# Patient Record
Sex: Male | Born: 1980 | Race: White | Marital: Single | Smoking: Former smoker
Health system: Southern US, Community
[De-identification: ages and names within clinical notes are randomized; demographics above are authoritative.]

## PROBLEM LIST (undated history)

## (undated) ENCOUNTER — Ambulatory Visit: Admission: EM

## (undated) HISTORY — PX: FRACTURE SURGERY: SHX138

## (undated) HISTORY — PX: EYE SURGERY: SHX253

---

## 2014-12-14 ENCOUNTER — Emergency Department: Payer: Self-pay

## 2014-12-14 ENCOUNTER — Encounter: Payer: Self-pay | Admitting: Emergency Medicine

## 2014-12-14 ENCOUNTER — Emergency Department
Admission: EM | Admit: 2014-12-14 | Discharge: 2014-12-14 | Disposition: A | Discharge status: Home / Self Care | Payer: Self-pay | Attending: Emergency Medicine | Admitting: Emergency Medicine

## 2014-12-14 DIAGNOSIS — N23 Unspecified renal colic: Secondary | ICD-10-CM

## 2014-12-14 DIAGNOSIS — R109 Unspecified abdominal pain: Secondary | ICD-10-CM

## 2014-12-14 LAB — CBC WITH DIFFERENTIAL/PLATELET
BASOS PCT: 1 %
Basophils Absolute: 0.1 10*3/uL (ref 0–0.1)
EOS PCT: 5 %
Eosinophils Absolute: 0.4 10*3/uL (ref 0–0.7)
HEMATOCRIT: 43.3 % (ref 40.0–52.0)
HEMOGLOBIN: 14.5 g/dL (ref 13.0–18.0)
Lymphocytes Relative: 22 %
Lymphs Abs: 2 10*3/uL (ref 1.0–3.6)
MCH: 30.6 pg (ref 26.0–34.0)
MCHC: 33.6 g/dL (ref 32.0–36.0)
MCV: 91.2 fL (ref 80.0–100.0)
MONO ABS: 0.4 10*3/uL (ref 0.2–1.0)
Monocytes Relative: 4 %
Neutro Abs: 6 10*3/uL (ref 1.4–6.5)
Neutrophils Relative %: 68 %
Platelets: 278 10*3/uL (ref 150–440)
RBC: 4.75 MIL/uL (ref 4.40–5.90)
RDW: 12.8 % (ref 11.5–14.5)
WBC: 8.9 10*3/uL (ref 3.8–10.6)

## 2014-12-14 LAB — COMPREHENSIVE METABOLIC PANEL
ALBUMIN: 4.2 g/dL (ref 3.5–5.0)
ALT: 12 U/L — ABNORMAL LOW (ref 17–63)
ANION GAP: 8 (ref 5–15)
AST: 17 U/L (ref 15–41)
Alkaline Phosphatase: 41 U/L (ref 38–126)
BILIRUBIN TOTAL: 0.5 mg/dL (ref 0.3–1.2)
BUN: 12 mg/dL (ref 6–20)
CALCIUM: 8.8 mg/dL — AB (ref 8.9–10.3)
CO2: 28 mmol/L (ref 22–32)
CREATININE: 1.2 mg/dL (ref 0.61–1.24)
Chloride: 103 mmol/L (ref 101–111)
GFR calc Af Amer: >60 mL/min (ref >60)
GFR calc non Af Amer: >60 mL/min (ref >60)
Glucose, Bld: 137 mg/dL — ABNORMAL HIGH (ref 65–99)
POTASSIUM: 3.8 mmol/L (ref 3.5–5.1)
SODIUM: 139 mmol/L (ref 135–145)
TOTAL PROTEIN: 6.9 g/dL (ref 6.5–8.1)

## 2014-12-14 LAB — URINALYSIS COMPLETE WITH MICROSCOPIC (ARMC ONLY)
Bacteria, UA: NONE SEEN
Bilirubin Urine: NEGATIVE
GLUCOSE, UA: NEGATIVE mg/dL
HGB URINE DIPSTICK: NEGATIVE
Ketones, ur: NEGATIVE mg/dL
LEUKOCYTES UA: NEGATIVE
NITRITE: NEGATIVE
PROTEIN: NEGATIVE mg/dL
Specific Gravity, Urine: 1.02 (ref 1.005–1.030)
Squamous Epithelial / LPF: NONE SEEN
pH: 6 (ref 5.0–8.0)

## 2014-12-14 LAB — LIPASE, BLOOD: Lipase: 33 U/L (ref 22–51)

## 2014-12-14 MED ORDER — OXYCODONE-ACETAMINOPHEN 5-325 MG PO TABS: 1.0000 | ORAL_TABLET | Freq: Four times a day (QID) | ORAL | Status: DC | PRN

## 2014-12-14 MED ORDER — HYDROMORPHONE HCL 1 MG/ML IJ SOLN
INTRAMUSCULAR | Status: AC
  Filled 2014-12-14: qty 1

## 2014-12-14 MED ORDER — KETOROLAC TROMETHAMINE 30 MG/ML IJ SOLN
30.0000 mg | Freq: Once | INTRAMUSCULAR | Status: AC
  Administered 2014-12-14: 30 mg via INTRAVENOUS

## 2014-12-14 MED ORDER — ONDANSETRON HCL 4 MG/2ML IJ SOLN
4.0000 mg | Freq: Once | INTRAMUSCULAR | Status: AC
  Administered 2014-12-14: 4 mg via INTRAVENOUS

## 2014-12-14 MED ORDER — SODIUM CHLORIDE 0.9 % IV SOLN
Freq: Once | INTRAVENOUS | Status: AC
  Administered 2014-12-14: 12:00:00 via INTRAVENOUS

## 2014-12-14 MED ORDER — TAMSULOSIN HCL 0.4 MG PO CAPS: 0.4000 mg | ORAL_CAPSULE | ORAL | Status: DC

## 2014-12-14 MED ORDER — FENTANYL CITRATE (PF) 100 MCG/2ML IJ SOLN
INTRAMUSCULAR | Status: AC
  Administered 2014-12-14: 50 ug via INTRAVENOUS
  Filled 2014-12-14: qty 2

## 2014-12-14 MED ORDER — HYDROMORPHONE HCL 1 MG/ML IJ SOLN
0.5000 mg | Freq: Once | INTRAMUSCULAR | Status: AC
  Administered 2014-12-14: 0.5 mg via INTRAVENOUS

## 2014-12-14 MED ORDER — FENTANYL CITRATE (PF) 100 MCG/2ML IJ SOLN
50.0000 ug | Freq: Once | INTRAMUSCULAR | Status: AC
  Administered 2014-12-14: 50 ug via INTRAVENOUS

## 2014-12-14 MED ORDER — ONDANSETRON HCL 4 MG/2ML IJ SOLN
INTRAMUSCULAR | Status: AC
  Administered 2014-12-14: 4 mg via INTRAVENOUS
  Filled 2014-12-14: qty 2

## 2014-12-14 MED ORDER — ONDANSETRON HCL 4 MG/2ML IJ SOLN
INTRAMUSCULAR | Status: AC
  Filled 2014-12-14: qty 2

## 2014-12-14 MED ORDER — TAMSULOSIN HCL 0.4 MG PO CAPS: 0.4000 mg | ORAL_CAPSULE | Freq: Once | ORAL | Status: DC

## 2014-12-14 MED ORDER — KETOROLAC TROMETHAMINE 30 MG/ML IJ SOLN
INTRAMUSCULAR | Status: AC
  Filled 2014-12-14: qty 1

## 2014-12-14 MED ORDER — ONDANSETRON HCL 4 MG PO TABS: 4.0000 mg | ORAL_TABLET | Freq: Every day | ORAL | Status: DC | PRN

## 2014-12-14 MED ORDER — HYDROMORPHONE HCL 1 MG/ML IJ SOLN
1.0000 mg | Freq: Once | INTRAMUSCULAR | Status: AC
Start: 2014-12-14 — End: 2014-12-14
  Administered 2014-12-14: 1 mg via INTRAVENOUS

## 2014-12-14 NOTE — ED Notes (Signed)
Patient transported to CT 

## 2014-12-14 NOTE — ED Notes (Signed)
Pt c/o left side flank pain acute onset. Some nausea.

## 2014-12-14 NOTE — ED Provider Notes (Signed)
[facility] Emergency Department Provider Note     Time seen: ----------------------------------------- 11:34 AM on 12/14/2014 -----------------------------------------    I have reviewed the triage vital signs and the nursing notes.   HISTORY  Chief Complaint Abdominal Pain    HPI Mathew Olsen is a 34 y.o. male since ER for sudden onset left-sided abdominal pain and flank pain. Patient does report history of nausea.Patient discussed pain is sharp and stabbing left-sided nothing makes it better or worse. Associated nausea and vomiting. Does not have history of same but there is a family history for kidney stones. Currently severe     History reviewed. No pertinent past medical history.  There are no active problems to display for this patient.   History reviewed. No pertinent past surgical history.  No current outpatient prescriptions on file.  Allergies Review of patient's allergies indicates no known allergies.  No family history on file.  Social History History  Substance Use Topics  . Smoking status: Never Smoker   . Smokeless tobacco: Not on file  . Alcohol Use: No    Review of Systems Constitutional: Negative for fever. Eyes: Negative for visual changes. ENT: Negative for sore throat. Cardiovascular: Negative for chest pain. Respiratory: Negative for shortness of breath. Gastrointestinal: Abdominal pain and nausea vomiting Genitourinary: Negative for dysuria. Musculoskeletal: Negative for back pain. Skin: Negative for rash. Neurological: Negative for headaches, focal weakness or numbness.  10-point ROS otherwise negative.  ____________________________________________   PHYSICAL EXAM:  VITAL SIGNS: ED Triage Vitals  Enc Vitals Group     BP 12/14/14 1035 130/89 mmHg     Pulse Rate 12/14/14 1035 56     Resp 12/14/14 1035 18     Temp 12/14/14 1035 98 F (36.7 C)     Temp Source 12/14/14 1035 Oral     SpO2 12/14/14  1035 98 %     Weight 12/14/14 1035 155 lb (70.308 kg)     Height 12/14/14 1035 5\' 6"  (1.676 m)     Head Cir --      Peak Flow --      Pain Score 12/14/14 1036 8     Pain Loc --      Pain Edu? --      Excl. in GC? --     Constitutional: Alert and oriented. Mild to moderate distress. Pale Eyes: Conjunctivae are normal. PERRL. Normal extraocular movements. ENT   Head: Normocephalic and atraumatic.   Nose: No congestion/rhinnorhea.   Mouth/Throat: Mucous membranes are moist.   Neck: No stridor. Hematological/Lymphatic/Immunilogical: No cervical lymphadenopathy. Cardiovascular: Normal rate, regular rhythm. Normal and symmetric distal pulses are present in all extremities. No murmurs, rubs, or gallops. Respiratory: Normal respiratory effort without tachypnea nor retractions. Breath sounds are clear and equal bilaterally. No wheezes/rales/rhonchi. Gastrointestinal: Left flank and CVA tenderness is noted. Normal bowel sounds. Musculoskeletal: Nontender with normal range of motion in all extremities. No joint effusions.  No lower extremity tenderness nor edema. Neurologic:  Normal speech and language. No gross focal neurologic deficits are appreciated. Speech is normal. No gait instability. Skin:  Skin is warm, dry and intact. No rash noted. Pallor is noted Psychiatric: Mood and affect are normal. Speech and behavior are normal. Patient exhibits appropriate insight and judgment.  ____________________________________________    LABS (pertinent positives/negatives)  Labs Reviewed  COMPREHENSIVE METABOLIC PANEL - Abnormal; Notable for the following:    Glucose, Bld 137 (*)    Calcium 8.8 (*)    ALT 12 (*)  All other components within normal limits  URINALYSIS COMPLETEWITH MICROSCOPIC ([facility] ONLY) - Abnormal; Notable for the following:    Color, Urine AMBER (*)    APPearance HAZY (*)    All other components within normal limits  CBC WITH DIFFERENTIAL/PLATELET  LIPASE,  BLOOD   ____________________________________________  ED COURSE:  Pertinent labs & imaging results that were available during my care of the patient were reviewed by me and considered in my medical decision making (see chart for details).  Patient received IV fluid as well as IV Toradol and Zofran. Likely renal colic ____________________________________________   RADIOLOGY  CT of the pelvis without contrast IMPRESSION: 2 mm calculus left ureterovesical junction causing mild hydronephrosis on the left. Multiple small intrarenal calculi bilaterally.  No bowel obstruction. No abscess. The periappendiceal region shows no inflammation.  There is a probable tiny calculus in the gallbladder. Gallbladder wall does not appear thickened by CT. ____________________________________________    FINAL ASSESSMENT AND PLAN  Renal colic Plan: Patient with 2 mm distal ureteral stone, will be discharged with Flomax, pain medicine and urology follow-up. Hopefully he will be able to pass the stone in the next few days.    Emily Filbert, MD   Emily Filbert, MD 12/14/14 [phone]

## 2014-12-14 NOTE — ED Notes (Signed)
Family at bedside. 

## 2014-12-14 NOTE — ED Notes (Signed)
Vital signs stable. 

## 2014-12-14 NOTE — ED Notes (Signed)
Pt noted to have heart rate in the 40's. edp made aware. Pt asymptomatic at this time. Vss. Cardiac monitor applied.  Cont to monitor.

## 2014-12-14 NOTE — Discharge Instructions (Signed)

## 2014-12-14 NOTE — ED Notes (Signed)
Sudden onset of left side abd and flank pain .The patient complains of pos nausea

## 2015-12-15 IMAGING — CT CT RENAL STONE PROTOCOL
1 of 2 series · 5 of 32 positions shown, 10 images · non-contrast
Comparison: None.

CLINICAL DATA: Abdominal pain for 2 weeks. One day history of more
focal left flank pain

EXAM:
CT ABDOMEN AND PELVIS WITHOUT CONTRAST
TECHNIQUE: Multidetector CT imaging of the abdomen and pelvis was performed
following the standard protocol without oral or intravenous contrast
material administration.

[Series 4: lung windows · axial · 0.66mm/px · z∈[-167,-72]mm · 5 of 29 slices shown, 10 images]
[im 5/29  soft-tissue]
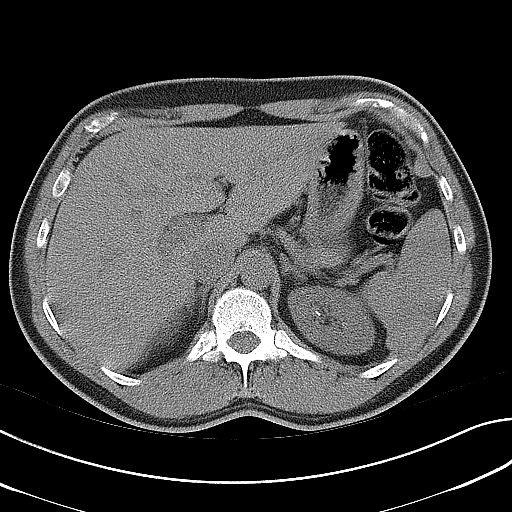
[im 5/29  bone]
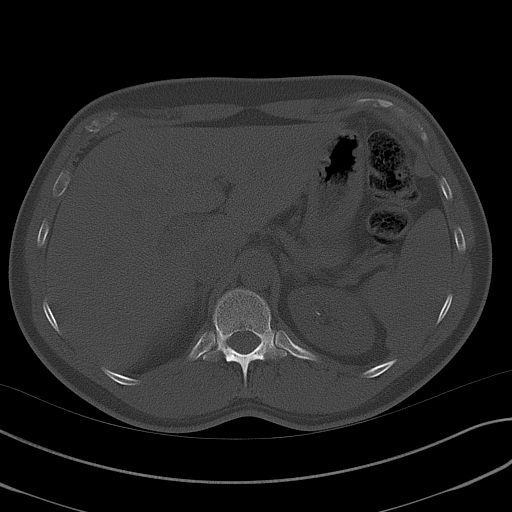
[im 10/29  soft-tissue]
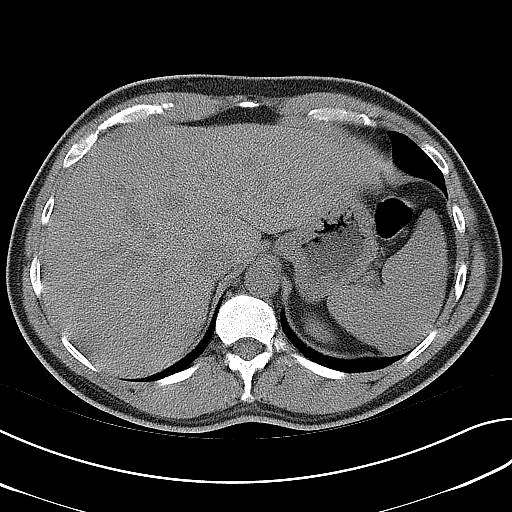
[im 10/29  lung]
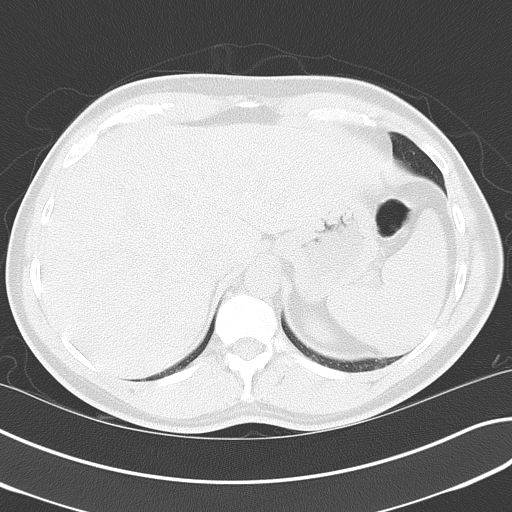
[im 15/29  soft-tissue]
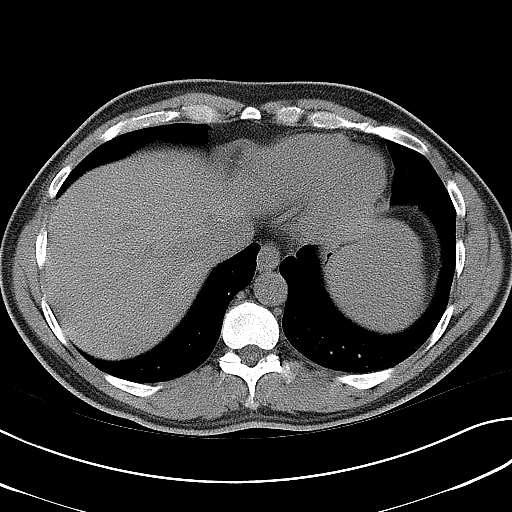
[im 15/29  lung]
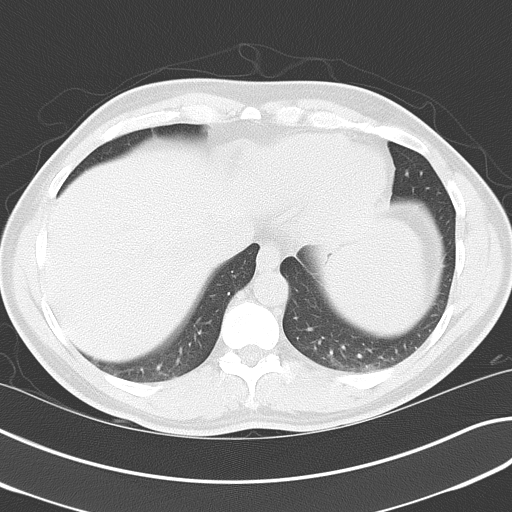
[im 19/29  soft-tissue]
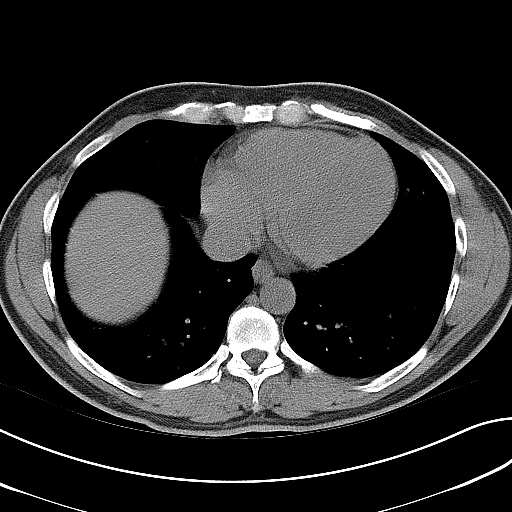
[im 19/29  lung]
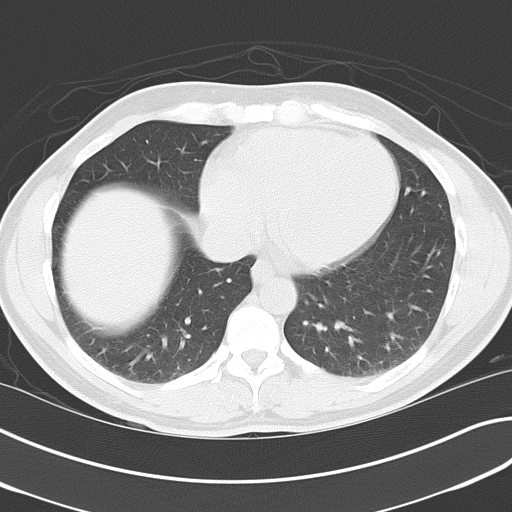
[im 24/29  soft-tissue]
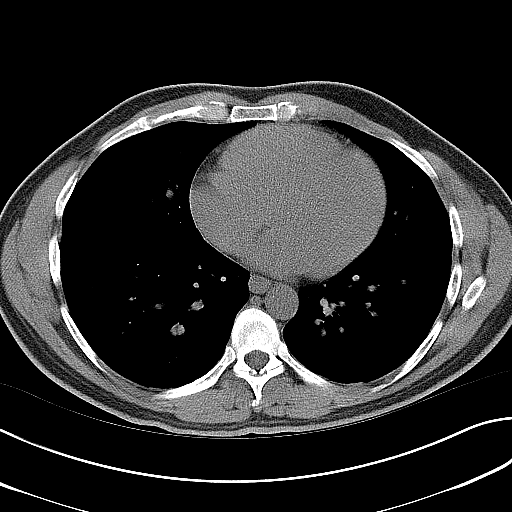
[im 24/29  lung]
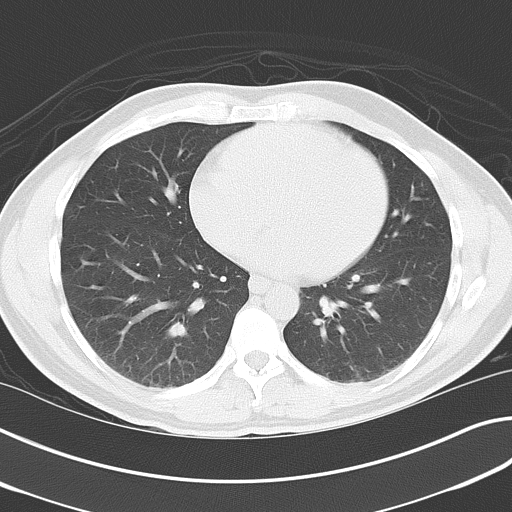

[5 of 32 positions shown; findings below may reference images not displayed]

FINDINGS: Lung bases are clear.

No focal liver lesions are identified on this noncontrast enhanced
study. There is a small focus of increased attenuation in the
gallbladder, felt to represent a small gallstone. Gallbladder wall
is not thickened. There is no biliary duct dilatation.

Spleen, pancreas, and right adrenal appear normal. There is a 1.0 x
1.0 cm probable adenoma in the left adrenal.

In the right kidney, there are multiple 1-2 mm calculi scattered
throughout the kidney. No hydronephrosis or right renal mass. There
is no right ureteral calculus.

On the left, there is a 4 x 2 mm calculus in the upper pole region.
There is a 4 x 4 mm calculus nearby in the upper pole left kidney.
There are multiple tiny calculi throughout the mid kidney. There are
two, 2 mm nonobstructing calculus in the lower pole as well as two 1
mm calculi in this region. There is mild hydronephrosis on the left.
There is no left renal mass. There is a 2 mm calculus at the left
ureterovesical junction. No other ureteral calculi are identified.

In the pelvis, the urinary bladder is decompressed. Mild thickening
of the wall of the urinary bladder is probably due to decompression.
There is no pelvic mass or pelvic fluid collection. There is no
periappendiceal region inflammation.

There is no bowel obstruction. No free air or portal venous air.
There is no demonstrable ascites, adenopathy, or abscess in the
abdomen or pelvis. There is no abdominal aortic aneurysm. There are
no blastic or lytic bone lesions.
IMPRESSION: 2 mm calculus left ureterovesical junction causing mild
hydronephrosis on the left. Multiple small intrarenal calculi
bilaterally.

No bowel obstruction. No abscess. The periappendiceal region shows
no inflammation.

There is a probable tiny calculus in the gallbladder. Gallbladder
wall does not appear thickened by CT.

## 2018-09-16 ENCOUNTER — Other Ambulatory Visit: Payer: Self-pay

## 2018-09-16 ENCOUNTER — Ambulatory Visit
Admission: EM | Admit: 2018-09-16 | Discharge: 2018-09-16 | Disposition: A | Discharge status: Home / Self Care | Payer: Commercial Managed Care - PPO | Attending: Family Medicine | Admitting: Family Medicine

## 2018-09-16 DIAGNOSIS — R05 Cough: Secondary | ICD-10-CM | POA: Diagnosis not present

## 2018-09-16 DIAGNOSIS — R6883 Chills (without fever): Secondary | ICD-10-CM | POA: Diagnosis not present

## 2018-09-16 DIAGNOSIS — J111 Influenza due to unidentified influenza virus with other respiratory manifestations: Secondary | ICD-10-CM | POA: Diagnosis not present

## 2018-09-16 DIAGNOSIS — R110 Nausea: Secondary | ICD-10-CM

## 2018-09-16 DIAGNOSIS — R5383 Other fatigue: Secondary | ICD-10-CM

## 2018-09-16 LAB — RAPID INFLUENZA A&B ANTIGENS
Influenza A (ARMC): POSITIVE — AB
Influenza B (ARMC): NEGATIVE

## 2018-09-16 MED ORDER — ONDANSETRON 4 MG PO TBDP: 4.0000 mg | ORAL_TABLET | Freq: Three times a day (TID) | ORAL | 0 refills | Status: DC | PRN

## 2018-09-16 MED ORDER — OSELTAMIVIR PHOSPHATE 75 MG PO CAPS: 75.0000 mg | ORAL_CAPSULE | Freq: Two times a day (BID) | ORAL | 0 refills | Status: DC

## 2018-09-16 NOTE — ED Provider Notes (Signed)
[facility]    CSN: [id number] Arrival date & time: 09/16/18  1243  History   Chief Complaint Chief Complaint  Patient presents with  . Fever    APPT    HPI  38 year old male presents with concerns for influenza.  Patient reports that symptoms started Monday night.  He reports nausea, generalized weakness, fever, body aches, cough, chills.  Patient is concerned that he has the flu.  No known exacerbating or relieving factors.  Symptoms are severe.  No reported sick contacts.  No other associated symptoms.  No other complaints.  History reviewed as below. PMH: Renal colic  Home Medications    Prior to Admission medications   Medication Sig Start Date End Date Taking? Authorizing Provider  ondansetron (ZOFRAN-ODT) 4 MG disintegrating tablet Take 1 tablet (4 mg total) by mouth every 8 (eight) hours as needed for nausea or vomiting. 09/16/18   Tommie Sams, DO  oseltamivir (TAMIFLU) 75 MG capsule Take 1 capsule (75 mg total) by mouth every 12 (twelve) hours. 09/16/18   Tommie Sams, DO    Family History Family History  Problem Relation Age of Onset  . Hypertension Father   . Heart attack Father     Social History Social History   Tobacco Use  . Smoking status: Never Smoker  . Smokeless tobacco: Never Used  Substance Use Topics  . Alcohol use: Yes    Comment: occasionally  . Drug use: Not Currently     Allergies   Amoxicillin and Penicillins   Review of Systems Review of Systems  Constitutional: Positive for chills, fatigue and fever.  Respiratory: Positive for cough.   Gastrointestinal: Positive for nausea.  Musculoskeletal:       Body aches/weakness.   Physical Exam Triage Vital Signs ED Triage Vitals  Enc Vitals Group     BP 09/16/18 1254 (!) 154/79     Pulse Rate 09/16/18 1254 (!) 107     Resp 09/16/18 1254 16     Temp 09/16/18 1254 (!) 100.8 F (38.2 C)     Temp Source 09/16/18 1254 Oral     SpO2 09/16/18 1254 98 %     Weight 09/16/18  1252 200 lb (90.7 kg)     Height 09/16/18 1252 5\' 7"  (1.702 m)     Head Circumference --      Peak Flow --      Pain Score 09/16/18 1251 4     Pain Loc --      Pain Edu? --      Excl. in GC? --    Updated Vital Signs BP (!) 154/79 (BP Location: Right Arm)   Pulse (!) 107   Temp (!) 100.8 F (38.2 C) (Oral)   Resp 16   Ht 5\' 7"  (1.702 m)   Wt 90.7 kg   SpO2 98%   BMI 31.32 kg/m   Visual Acuity Right Eye Distance:   Left Eye Distance:   Bilateral Distance:    Right Eye Near:   Left Eye Near:    Bilateral Near:     Physical Exam Vitals signs and nursing note reviewed.  Constitutional:      General: He is not in acute distress.    Appearance: He is obese.  HENT:     Head: Normocephalic and atraumatic.     Right Ear: Tympanic membrane normal.     Left Ear: Tympanic membrane normal.  Eyes:     General:  Right eye: No discharge.        Left eye: No discharge.     Conjunctiva/sclera: Conjunctivae normal.  Cardiovascular:     Rate and Rhythm: Regular rhythm. Tachycardia present.  Pulmonary:     Effort: Pulmonary effort is normal.     Breath sounds: Normal breath sounds.  Neurological:     Mental Status: He is alert.  Psychiatric:        Mood and Affect: Mood normal.        Behavior: Behavior normal.    UC Treatments / Results  Labs (all labs ordered are listed, but only abnormal results are displayed) Labs Reviewed  RAPID INFLUENZA A&B ANTIGENS ([facility] ONLY) - Abnormal; Notable for the following components:      Result Value   Influenza A ([facility]) POSITIVE (*)    All other components within normal limits    EKG None  Radiology No results found.  Procedures Procedures (including critical care time)  Medications Ordered in UC Medications - No data to display  Initial Impression / Assessment and Plan / UC Course  I have reviewed the triage vital signs and the nursing notes.  Pertinent labs & imaging results that were available during my care of  the patient were reviewed by me and considered in my medical decision making (see chart for details).    38 year old male presents with influenza.  Treating with Tamiflu.  Zofran as needed.  Supportive care.  Work note given.  Final Clinical Impressions(s) / UC Diagnoses   Final diagnoses:  Influenza   Discharge Instructions   None    ED Prescriptions    Medication Sig Dispense Auth. Provider   ondansetron (ZOFRAN-ODT) 4 MG disintegrating tablet Take 1 tablet (4 mg total) by mouth every 8 (eight) hours as needed for nausea or vomiting. 20 tablet Armie Moren G, DO   oseltamivir (TAMIFLU) 75 MG capsule Take 1 capsule (75 mg total) by mouth every 12 (twelve) hours. 10 capsule Tommie Sams, DO     Controlled Substance Prescriptions [place] Controlled Substance Registry consulted? Not Applicable   Tommie Sams, DO 09/16/18 1424

## 2018-09-16 NOTE — ED Triage Notes (Signed)
Patient complains of cough, fever, chills and body aches since Monday evening.

## 2018-09-23 ENCOUNTER — Ambulatory Visit
Admission: EM | Admit: 2018-09-23 | Discharge: 2018-09-23 | Disposition: A | Discharge status: Home / Self Care | Payer: Commercial Managed Care - PPO | Attending: Internal Medicine | Admitting: Internal Medicine

## 2018-09-23 ENCOUNTER — Encounter: Payer: Self-pay | Admitting: Emergency Medicine

## 2018-09-23 ENCOUNTER — Other Ambulatory Visit: Payer: Self-pay

## 2018-09-23 DIAGNOSIS — Z87891 Personal history of nicotine dependence: Secondary | ICD-10-CM | POA: Diagnosis not present

## 2018-09-23 DIAGNOSIS — J181 Lobar pneumonia, unspecified organism: Secondary | ICD-10-CM | POA: Diagnosis not present

## 2018-09-23 DIAGNOSIS — J189 Pneumonia, unspecified organism: Secondary | ICD-10-CM

## 2018-09-23 MED ORDER — ALBUTEROL SULFATE HFA 108 (90 BASE) MCG/ACT IN AERS: 1.0000 | INHALATION_SPRAY | RESPIRATORY_TRACT | 0 refills | Status: AC | PRN

## 2018-09-23 MED ORDER — DOXYCYCLINE HYCLATE 100 MG PO CAPS: 100.0000 mg | ORAL_CAPSULE | Freq: Two times a day (BID) | ORAL | 0 refills | Status: DC

## 2018-09-23 MED ORDER — PREDNISONE 10 MG PO TABS: 20.0000 mg | ORAL_TABLET | Freq: Every day | ORAL | 0 refills | Status: AC

## 2018-09-23 NOTE — ED Triage Notes (Signed)
Pt c/o cough, nasal congestion, sinus pain and pressure. He had the flu last week and the cough and congestion will not go away. He also has noticed that the last couple of days he has had blood mixed in the nasal mucus. Other flu symptoms have resolved.
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
## 2020-07-24 ENCOUNTER — Other Ambulatory Visit: Payer: Self-pay

## 2020-07-24 DIAGNOSIS — Z20822 Contact with and (suspected) exposure to covid-19: Secondary | ICD-10-CM

## 2020-07-25 LAB — NOVEL CORONAVIRUS, NAA: SARS-CoV-2, NAA: NOT DETECTED

## 2020-07-25 LAB — SARS-COV-2, NAA 2 DAY TAT

## 2021-12-16 ENCOUNTER — Ambulatory Visit
Admission: EM | Admit: 2021-12-16 | Discharge: 2021-12-16 | Disposition: A | Discharge status: Home / Self Care | Payer: Self-pay | Attending: Emergency Medicine | Admitting: Emergency Medicine

## 2021-12-16 ENCOUNTER — Encounter: Payer: Self-pay | Admitting: Emergency Medicine

## 2021-12-16 DIAGNOSIS — J019 Acute sinusitis, unspecified: Secondary | ICD-10-CM

## 2021-12-16 DIAGNOSIS — H66001 Acute suppurative otitis media without spontaneous rupture of ear drum, right ear: Secondary | ICD-10-CM

## 2021-12-16 MED ORDER — DOXYCYCLINE HYCLATE 100 MG PO CAPS: 100.0000 mg | ORAL_CAPSULE | Freq: Two times a day (BID) | ORAL | 0 refills | Status: AC
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
## 2021-12-16 NOTE — ED Triage Notes (Signed)
Patient c/o cough, congestion, and runny nose that started a week ago.  Patient reports pain in right ear that started on Thursday. Patient denies fevers.

## 2021-12-16 NOTE — Discharge Instructions (Addendum)
Finish the doxycycline, even if you feel better.  This should take care of an ear infection as well as a sinus infection.  Continue Mucinex D, Flonase, start saline nasal irrigation with a Milta Deiters Med rinse and distilled water as often as you want.  Here is a list of primary care providers who are taking new patients:  [facility] [place] Dr. Rosette Reveal (sports medicine) Dr. Otilio Miu [address] [place] [address] [place] [facility] [place], [place] [address] [place] [address]  [phone]  [facility] [address] [place], [place] [address] [phone]  [facility]  [phone] [place], [place] [address]  Here are clinics/ other resources who will see you if you do not have insurance. Some have certain criteria that you must meet. Call them and find out what they are:  [facility]: [address] [place], [place] [address] Phone: [phone] Hours: First and Third Saturdays of each Month, 9 a.m. - 1 p.m.  [facility]: [address], [place], [place], [place] [address] Phone: [phone] Hours: Tuesday, 4 p.m. - 8 p.m. Thursday, 1 p.m. - 8 p.m. Wednesday, 9 a.m. - [facility] [address] [place], [place] [address] Phone: [phone] Pharmacy Phone Number: [phone] Dental Phone Number: [phone] [facility]: [phone]  Dental Hours: Monday - Thursday, 8 a.m. - 6 p.m.  [facility] [address], [place], [place] [address] Phone: [phone] Pharmacy Phone Number: [phone] [facility] Insurance Help: [phone]  [facility] [address] [place], [place] [address] Phone: [phone] Pharmacy Phone Number: [phone] [facility] Insurance Help: [phone]  [facility] [address] [place], [place] [address] Phone: [phone] [facility] Insurance Help: [phone]   [address] [place], [place] [address] Phone: [phone]  Go to www.goodrx.com  or www.costplusdrugs.com to look up your medications. This will give you a list of where you can find your prescriptions at the most affordable prices. Or ask the pharmacist what the cash price is, or if they have any other discount programs available to help make your medication more affordable. This can be less expensive than what you would pay with insurance.

## 2022-08-06 ENCOUNTER — Ambulatory Visit
Admission: EM | Admit: 2022-08-06 | Discharge: 2022-08-06 | Disposition: A | Discharge status: Home / Self Care | Payer: Self-pay

## 2022-08-06 ENCOUNTER — Encounter: Payer: Self-pay | Admitting: Emergency Medicine

## 2022-08-06 DIAGNOSIS — U071 COVID-19: Secondary | ICD-10-CM

## 2022-08-06 NOTE — ED Notes (Signed)
Provider triage  

## 2022-08-06 NOTE — Discharge Instructions (Addendum)
Take Tylenol as needed for fever or discomfort.  Rest and keep yourself hydrated.    Follow-up with your primary care provider if your symptoms are not improving.     
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
## 2023-06-16 ENCOUNTER — Encounter: Payer: Self-pay | Admitting: Emergency Medicine

## 2023-06-16 ENCOUNTER — Ambulatory Visit
Admission: EM | Admit: 2023-06-16 | Discharge: 2023-06-16 | Disposition: A | Discharge status: Home / Self Care | Payer: Self-pay

## 2023-06-16 DIAGNOSIS — H6991 Unspecified Eustachian tube disorder, right ear: Secondary | ICD-10-CM

## 2023-06-16 NOTE — Discharge Instructions (Signed)
-  Continue flonase. Add antihistamine. Monitor and if no improvement or worsening of symptoms in the next couple months follow-up with your primary care provider as you might need an ENT referral.
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
## 2023-06-16 NOTE — ED Triage Notes (Signed)
Pt presents with right ear pain x 1-2 weeks.

## 2023-09-10 ENCOUNTER — Ambulatory Visit
Admission: EM | Admit: 2023-09-10 | Discharge: 2023-09-10 | Disposition: A | Discharge status: Home / Self Care | Payer: Commercial Managed Care - PPO | Attending: Family Medicine | Admitting: Family Medicine

## 2023-09-10 DIAGNOSIS — J101 Influenza due to other identified influenza virus with other respiratory manifestations: Secondary | ICD-10-CM | POA: Diagnosis present

## 2023-09-10 LAB — RESP PANEL BY RT-PCR (FLU A&B, COVID) ARPGX2
Influenza A by PCR: POSITIVE — AB
Influenza B by PCR: NEGATIVE
SARS Coronavirus 2 by RT PCR: NEGATIVE

## 2023-09-10 MED ORDER — ONDANSETRON 4 MG PO TBDP: 4.0000 mg | ORAL_TABLET | Freq: Three times a day (TID) | ORAL | 0 refills | Status: AC | PRN
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
## 2023-09-10 NOTE — Discharge Instructions (Signed)
 You have influenza A.  Tamiflu was not prescribed.  Your symptoms will gradually improve over the next 7 to 10 days.  The cough may last about 3 weeks.   Take ibuprofen 600 mg with Tylenol 1000 mg for fever, headache or body aches.   For cough: You can also use guaifenesin and dextromethorphan for cough. You can use a humidifier for chest congestion and cough.  If you don't have a humidifier, you can sit in the bathroom with the hot shower running.      For sore throat: try warm salt water gargles, Mucinex sore throat cough drops or cepacol lozenges, throat spray, warm tea or water with lemon/honey, popsicles or ice, or OTC cold relief medicine for throat discomfort. You can also purchase chloraseptic spray at the pharmacy or dollar store.   For congestion: take a daily anti-histamine like Zyrtec, Claritin, and a oral decongestant, such as pseudoephedrine.  You can also use Flonase 1-2 sprays in each nostril daily. Afrin is also a good option, if you do not have high blood pressure.    It is important to stay hydrated: drink plenty of fluids (water, gatorade/powerade/pedialyte, juices, or teas) to keep your throat moisturized and help further relieve irritation/discomfort.    Return or go to the Emergency Department if symptoms worsen or do not improve in the next few days

## 2023-09-10 NOTE — ED Triage Notes (Signed)
 SX started last night   Cough Post nasal drip Diarrhea Emesis Headache Facial pain Bodyaches

## 2023-09-11 ENCOUNTER — Ambulatory Visit: Payer: Self-pay
